# Patient Record
Sex: Male | Born: 1985 | Race: White | Hispanic: No | Marital: Married | Smoking: Never smoker
Health system: Southern US, Community
[De-identification: ages and names within clinical notes are randomized; demographics above are authoritative.]

---

## 2017-12-02 ENCOUNTER — Other Ambulatory Visit: Payer: Self-pay | Admitting: Medical Oncology

## 2017-12-02 DIAGNOSIS — R7989 Other specified abnormal findings of blood chemistry: Secondary | ICD-10-CM

## 2017-12-02 DIAGNOSIS — R945 Abnormal results of liver function studies: Principal | ICD-10-CM

## 2017-12-06 ENCOUNTER — Ambulatory Visit
Admission: RE | Admit: 2017-12-06 | Discharge: 2017-12-06 | Disposition: A | Discharge status: Home / Self Care | Payer: Commercial Managed Care - PPO | Source: Ambulatory Visit | Attending: Medical Oncology | Admitting: Medical Oncology

## 2017-12-06 DIAGNOSIS — R7989 Other specified abnormal findings of blood chemistry: Secondary | ICD-10-CM

## 2017-12-06 DIAGNOSIS — R945 Abnormal results of liver function studies: Secondary | ICD-10-CM | POA: Insufficient documentation

## 2017-12-06 DIAGNOSIS — K8020 Calculus of gallbladder without cholecystitis without obstruction: Secondary | ICD-10-CM | POA: Diagnosis not present

## 2018-01-01 ENCOUNTER — Other Ambulatory Visit (HOSPITAL_COMMUNITY): Payer: Self-pay | Admitting: Gastroenterology

## 2018-01-01 ENCOUNTER — Other Ambulatory Visit: Payer: Self-pay | Admitting: Gastroenterology

## 2018-01-17 ENCOUNTER — Encounter (INDEPENDENT_AMBULATORY_CARE_PROVIDER_SITE_OTHER): Payer: Self-pay

## 2018-01-17 ENCOUNTER — Ambulatory Visit
Admission: RE | Admit: 2018-01-17 | Discharge: 2018-01-17 | Disposition: A | Discharge status: Home / Self Care | Payer: Commercial Managed Care - PPO | Source: Ambulatory Visit | Attending: Gastroenterology | Admitting: Gastroenterology

## 2018-01-17 DIAGNOSIS — R7989 Other specified abnormal findings of blood chemistry: Secondary | ICD-10-CM | POA: Diagnosis not present

## 2018-01-17 DIAGNOSIS — K760 Fatty (change of) liver, not elsewhere classified: Secondary | ICD-10-CM | POA: Diagnosis not present

## 2018-01-17 DIAGNOSIS — K8020 Calculus of gallbladder without cholecystitis without obstruction: Secondary | ICD-10-CM | POA: Diagnosis not present

## 2018-01-17 DIAGNOSIS — R945 Abnormal results of liver function studies: Secondary | ICD-10-CM | POA: Insufficient documentation

## 2018-01-17 MED ORDER — GADOBUTROL 1 MMOL/ML IV SOLN
10.0000 mL | Freq: Once | INTRAVENOUS | Status: AC | PRN
  Administered 2018-01-17: 10 mL via INTRAVENOUS

## 2018-01-22 ENCOUNTER — Other Ambulatory Visit
Admission: RE | Admit: 2018-01-22 | Discharge: 2018-01-22 | Disposition: A | Discharge status: Home / Self Care | Payer: Commercial Managed Care - PPO | Source: Ambulatory Visit | Attending: Gastroenterology | Admitting: Gastroenterology

## 2018-01-22 DIAGNOSIS — R1032 Left lower quadrant pain: Secondary | ICD-10-CM | POA: Diagnosis present

## 2018-01-22 DIAGNOSIS — R197 Diarrhea, unspecified: Secondary | ICD-10-CM | POA: Diagnosis present

## 2018-01-22 DIAGNOSIS — R1031 Right lower quadrant pain: Secondary | ICD-10-CM | POA: Insufficient documentation

## 2018-01-22 LAB — GASTROINTESTINAL PANEL BY PCR, STOOL (REPLACES STOOL CULTURE)

## 2018-01-22 LAB — C DIFFICILE QUICK SCREEN W PCR REFLEX
C Diff antigen: NEGATIVE
C Diff interpretation: NOT DETECTED
C Diff toxin: NEGATIVE

## 2018-01-23 LAB — CALPROTECTIN, FECAL: Calprotectin, Fecal: 43 ug/g (ref 0–120)

## 2018-03-13 ENCOUNTER — Ambulatory Visit: Payer: Commercial Managed Care - PPO | Admitting: Dietician

## 2018-04-14 ENCOUNTER — Encounter: Payer: Self-pay | Admitting: Dietician

## 2018-04-14 NOTE — Progress Notes (Signed)
Have not heard back from patient to reschedule his cancelled appointment from 03/13/18. Sent letter to referring provider.

## 2018-06-04 ENCOUNTER — Encounter: Payer: Commercial Managed Care - PPO | Attending: Gastroenterology | Admitting: Dietician

## 2018-06-04 ENCOUNTER — Encounter: Payer: Self-pay | Admitting: Dietician

## 2018-06-04 ENCOUNTER — Other Ambulatory Visit: Payer: Self-pay

## 2018-06-04 VITALS — Ht 70.0 in | Wt 268.8 lb

## 2018-06-04 DIAGNOSIS — K7581 Nonalcoholic steatohepatitis (NASH): Secondary | ICD-10-CM

## 2018-06-04 NOTE — Patient Instructions (Signed)
   Plan ahead for more balanced meals emphasizing lean proteins (limit beef and other red meats) + generous portions of low-carb veggies + small portions of starches, ideally whole grains.   Limit added sugars in foods as well as "white"/ processed starches to reduce liver fat.   Limiting foods high in iron, include foods that reduce iron absorption such as calcium-containing foods, beets, berries, cranberries, dark chocolate, coffee, tea.   Consider Mediterranean style diet for liver health and weight control.   "Skinny Liver" book by Gwinda Maine can be another helpful resource.

## 2018-06-04 NOTE — Progress Notes (Signed)
Medical Nutrition Therapy: Visit start time: 0930  end time: 1030  Assessment:  Diagnosis: NASH, hemochromatosis Past medical history: none significant Psychosocial issues/ stress concerns: patient reports moderate stress level, does eat more when stressed  Preferred learning method:  . Auditory . Visual . Hands-on  Current weight: 268.8lbs with shoes  Height: 5'10" Medications, supplements: reconciled list in medical record  Progress and evaluation:   Patient reports elevated liver enzymes x 1 year, recent biopsy indicated fatty liver. Hemochromatosis discovered recently also, hereditary.   He reports weight fluctuations in past, history of dieting, has lost about 50lbs several times when dieting but regained.   He reports some stress eating -- eats large portions when stressed; he is working with a Aeronautical engineer.   He has been eating more meals at home recently due to pandemic and furlough from work.   He and his wife have recently increased physical activity.   Physical activity: walk, run, biking 30 minutes, 3-4 times a week.   Dietary Intake:  Usual eating pattern includes 3 meals and 1-3 snacks per day. Dining out frequency: 2-3 meals per week.  Breakfast: 7-8am 1-2 eggs + whole grain toast; or instant oatmeal + coffee, water; was eating out 1-2x a week sandwich at [employer]: yogurt, cheese stick; PBJ Lunch: usually leftovers including protein ie chicken, steak, salmon; was eating out 2-3x a week + sparkling water Snack: not daily-- crackers ie goldfish; nutrigrain bar Supper: chicken/ steak/ salmon + broccoli/ peas/ Brussels sprouts/ salad + 1 carb ie potatoes. Uses air fryer; restaurant meals 2-3x a week Snack: was eating popcorn, but less lately; grazes in fridge Beverages: water, sugar free coffee, tea, sparkling water, diet coke once daily since working from home; stopped alcohol 12/2017, was drinking 3 drinks per week.   Nutrition Care  Education: Topics covered: liver health/ NASH, hemachromatosis Basic nutrition:  appropriate nutrient balance Weight control: benefits of weight control Liver health/ NASH: importance of low sugar intake, limiting processed starches, limiting high fat foods especially saturated/ animal fats; role of weight control; role of physical activity; discussed Mediterranean style eating  Hemachromatosis: foods high and low in iron, foods that impair iron absorption  Nutritional Diagnosis:  [place]-2.2 Altered nutrition-related laboratory As related to NASH and hemachromatosis.  As evidenced by elevated liver enzymes and liver biopsy 12/2017, and elevated iron. [place]-3.3 Overweight/obesity As related to excess calories.  As evidenced by patient with current BMI of 38, and patient report of past dietary habits.  Intervention:   Instruction as noted above.  Patient has been working on diet and lifestyle changes to lose weight and improve liver health.   Established goals for further dietary change with direction from patient.   He will continue to work to reduce stress eating.   No follow-up scheduled at this time; patient will schedule later if needed.  Education Materials given:  Marland Kitchen Iron content of foods ([facility]) . Diet for Hemachromatosis ([facility]) . Mediterranean Style Eating handout . Goals/ instructions   Learner/ who was taught:  . Patient   Level of understanding: Marland Kitchen Verbalizes/ demonstrates competency   Demonstrated degree of understanding via:   Teach back Learning barriers: . None  Willingness to learn/ readiness for change: . Eager, change in progress   Monitoring and Evaluation:  Dietary intake, exercise, liver health and iron level, and body weight      follow up: prn

## 2018-12-01 ENCOUNTER — Other Ambulatory Visit: Payer: Self-pay

## 2018-12-01 DIAGNOSIS — Z20822 Contact with and (suspected) exposure to covid-19: Secondary | ICD-10-CM

## 2018-12-02 LAB — NOVEL CORONAVIRUS, NAA: SARS-CoV-2, NAA: NOT DETECTED

## 2020-05-21 IMAGING — MR MR ABDOMEN WO/W CM
16 of 17 series · 43 of 48 positions shown · IV contrast (10 GADAVIST)
Comparison: Ultrasound on 12/06/2017

CLINICAL DATA: Elevated liver function tests. Fatty liver.
Suspected hemochromatosis.

EXAM:
MRI ABDOMEN WITHOUT AND WITH CONTRAST
TECHNIQUE: Multiplanar multisequence MR imaging of the abdomen was performed
both before and after the administration of intravenous contrast.
CONTRAST:  10 mL Gadavist

[Series 2: cor ssfse / · coronal · 7.0mm · 1.48mm/px · 3 of 35 slices shown]
[im 1/35]
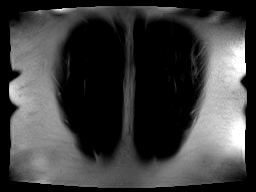
[im 18/35]
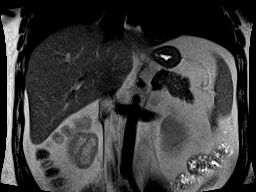
[im 35/35]
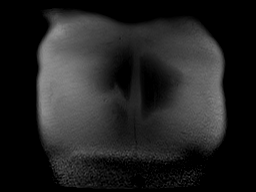

[Series 3: T2 fat-sat · axial · 6.0mm · 1.48mm/px · 1 of 30 slices shown]
[im 1/30]
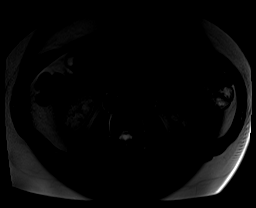

[Series 4: T1 · axial · 6.0mm · 0.74mm/px · z∈[-158,+51]mm · 3 of 60 slices shown]
[im 1/60]
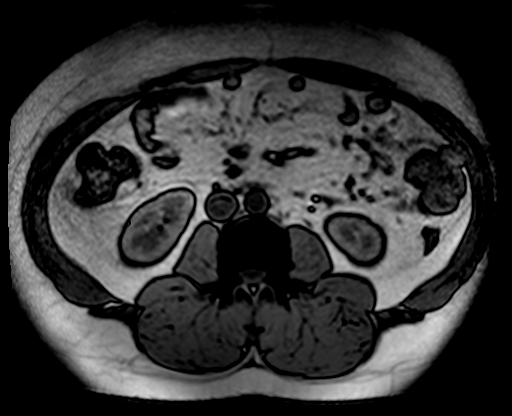
[im 30/60]
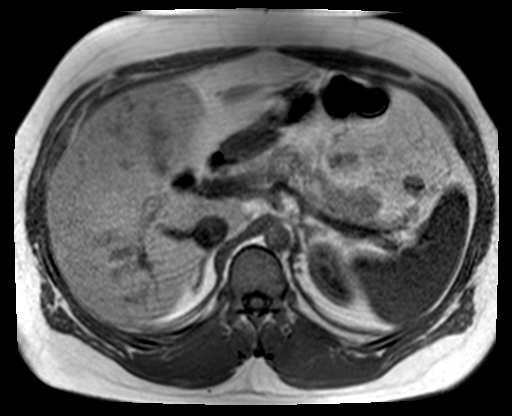
[im 60/60]
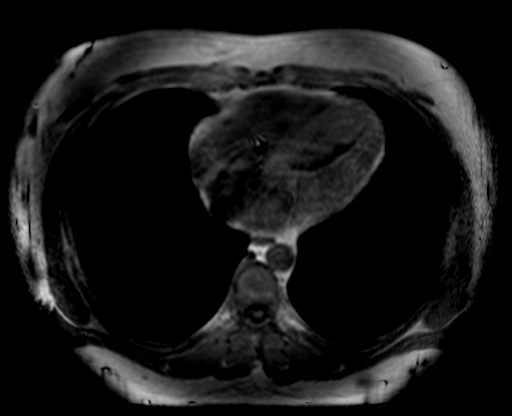

[Series 5: DWI · axial · 6.0mm · 2.00mm/px · z∈[-163,+82]mm · 5 of 105 slices shown (1 of 2)]
[im 1/105]
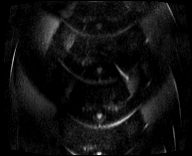
[im 27/105]
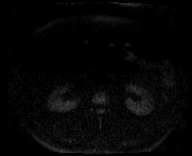
[im 53/105]
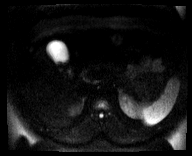
[im 79/105]
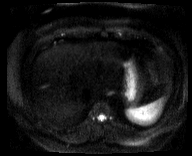
[im 105/105]
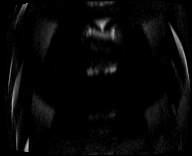

[Series 6: DWI · axial · 6.0mm · 2.00mm/px · z∈[-163,+82]mm · 2 of 35 slices shown (2 of 2)]
[im 1/35]
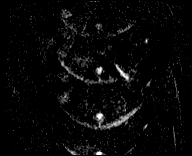
[im 35/35]
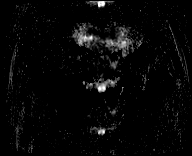

[Series 7: bSSFP · axial · 6.0mm · 0.74mm/px · 1 of 31 slices shown]
[im 1/31]
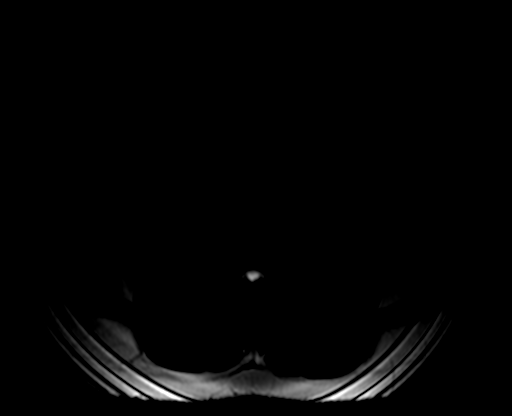

[Series 8: axial dynamic pre · axial · non-contrast · 3.0mm · 1.19mm/px · z∈[-160,+53]mm · 3 of 72 slices shown]
[im 1/72]
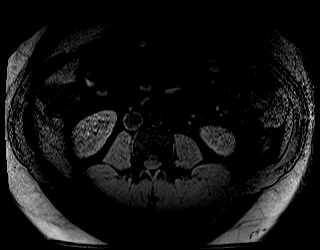
[im 36/72]
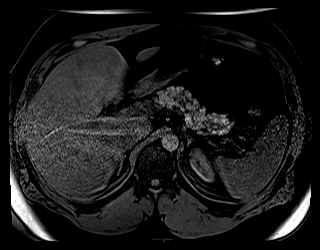
[im 72/72]
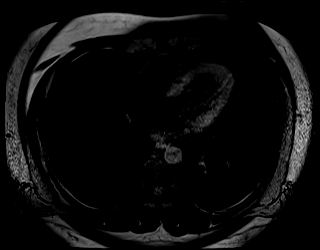

[Series 9: axial dynamic post · axial · 3.0mm · 1.19mm/px · z∈[-160,+53]mm · 3 of 72 slices shown (1 of 6)]
[im 1/72]
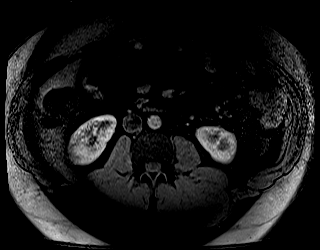
[im 36/72]
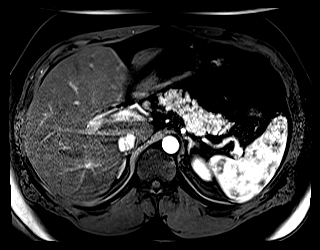
[im 72/72]
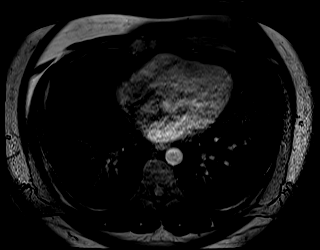

[Series 10: axial dynamic post · axial · 3.0mm · 1.19mm/px · z∈[-160,+53]mm · 3 of 72 slices shown (2 of 6)]
[im 1/72]
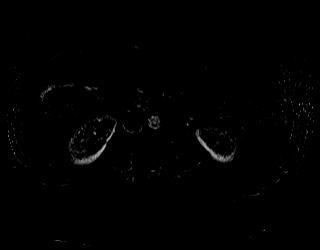
[im 36/72]
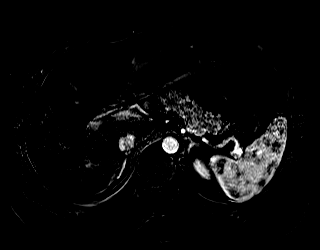
[im 72/72]
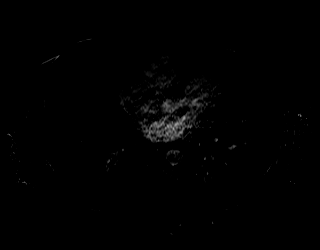

[Series 11: axial dynamic post · axial · 3.0mm · 1.19mm/px · z∈[-160,+53]mm · 3 of 72 slices shown (3 of 6)]
[im 1/72]
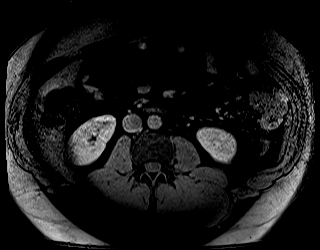
[im 36/72]
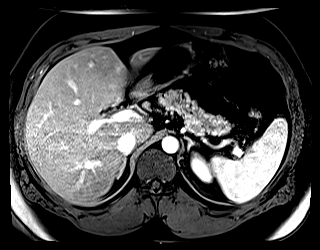
[im 72/72]
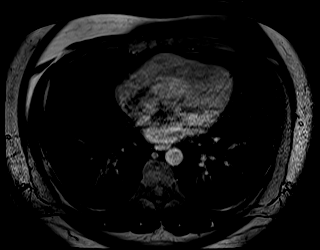

[Series 12: axial dynamic post · axial · 3.0mm · 1.19mm/px · z∈[-160,+53]mm · 3 of 72 slices shown (4 of 6)]
[im 1/72]
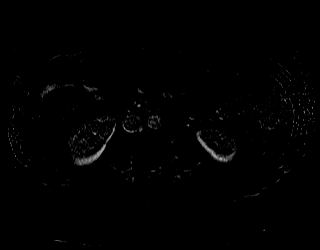
[im 36/72]
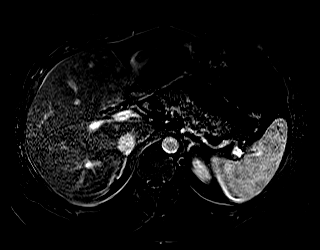
[im 72/72]
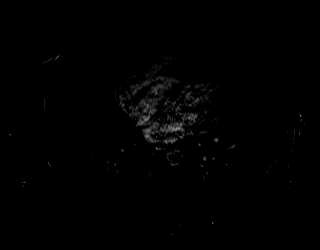

[Series 13: axial dynamic post · axial · 3.0mm · 1.19mm/px · z∈[-160,+53]mm · 3 of 72 slices shown (5 of 6)]
[im 1/72]
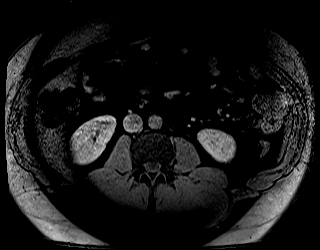
[im 36/72]
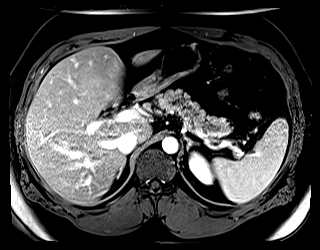
[im 72/72]
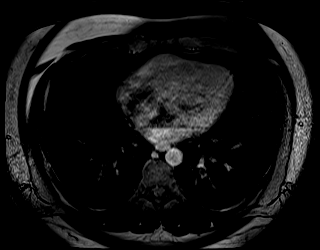

[Series 14: axial dynamic post · axial · 3.0mm · 1.19mm/px · z∈[-160,+53]mm · 3 of 72 slices shown (6 of 6)]
[im 1/72]
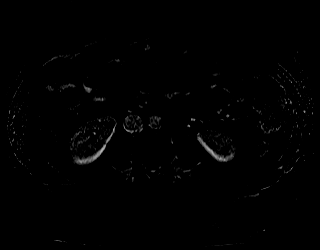
[im 36/72]
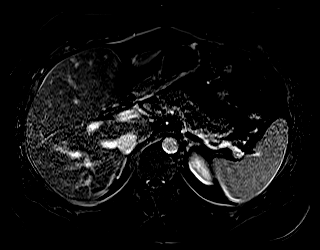
[im 72/72]
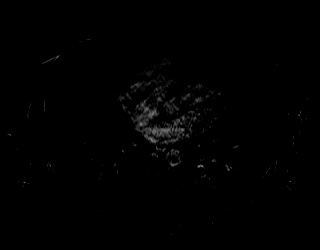

[Series 16: axial dynamic delayed · axial · 3.0mm · 1.19mm/px · z∈[-160,+53]mm · 3 of 72 slices shown]
[im 1/72]
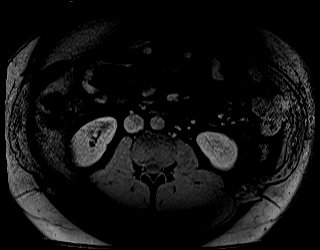
[im 36/72]
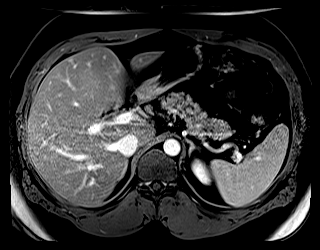
[im 72/72]
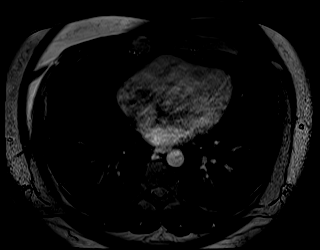

[Series 17: axial dynamic delayed_sub · axial · 3.0mm · 1.19mm/px · z∈[-160,+53]mm · 3 of 72 slices shown]
[im 1/72]
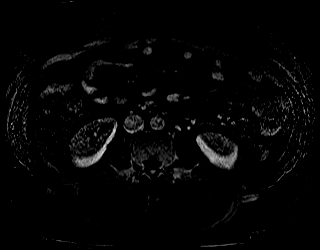
[im 36/72]
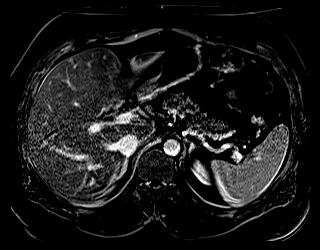
[im 72/72]
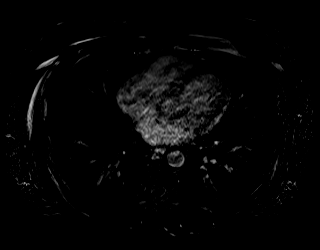

[Series 18: axial ssfse / · axial · 6.0mm · 1.19mm/px · 1 of 30 slices shown]
[im 1/30]
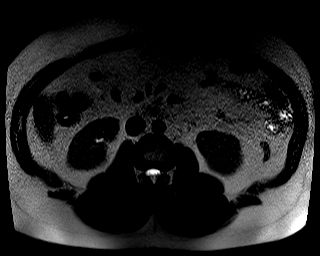

[43 of 48 positions shown; findings below may reference images not displayed]

FINDINGS: Lower Chest: No acute findings.

Hepatobiliary: No hepatic masses identified. Moderate to severe
diffuse hepatic steatosis is demonstrated. No evidence of abnormal
iron deposition.

Multiple gallstones are seen, however there is no evidence of
cholecystitis or biliary ductal dilatation.

Pancreas:  No mass or inflammatory changes.

Spleen: Within normal limits in size and appearance. No evidence of
abnormal iron deposition.

Adrenals/Urinary Tract: No masses identified. No evidence of
hydronephrosis.

Stomach/Bowel:  Stomach and visualized bowel loops are unremarkable.

Vascular/Lymphatic: No pathologically enlarged lymph nodes. No
abdominal aortic aneurysm.

Other:  None.

Musculoskeletal:  No suspicious bone lesions identified.
IMPRESSION: Moderate to severe hepatic steatosis. No evidence of
hemochromatosis.

Cholelithiasis. No radiographic evidence of cholecystitis or biliary
ductal dilatation.
